# Patient Record
Sex: Female | Born: 1950 | Race: White | Hispanic: No | Marital: Married
Health system: Southern US, Community
[De-identification: ages and names within clinical notes are randomized; demographics above are authoritative.]

## PROBLEM LIST (undated history)

## (undated) DIAGNOSIS — E039 Hypothyroidism, unspecified: Secondary | ICD-10-CM

## (undated) HISTORY — PX: BREAST BIOPSY: SHX20

---

## 2019-10-06 ENCOUNTER — Ambulatory Visit: Payer: Self-pay | Attending: Internal Medicine

## 2019-10-06 DIAGNOSIS — Z23 Encounter for immunization: Secondary | ICD-10-CM | POA: Insufficient documentation

## 2019-10-06 NOTE — Progress Notes (Signed)
   Covid-19 Vaccination Clinic  Name:  Kirby Argueta    MRN: [id number] DOB: 28-Mar-1951  10/06/2019  Ms. Tague was observed post Covid-19 immunization for 15 minutes without incidence. She was provided with Vaccine Information Sheet and instruction to access the V-Safe system.   Ms. Kruser was instructed to call 911 with any severe reactions post vaccine: Marland Kitchen Difficulty breathing  . Swelling of your face and throat  . A fast heartbeat  . A bad rash all over your body  . Dizziness and weakness    Immunizations Administered    Name Date Dose VIS Date Route   Pfizer COVID-19 Vaccine 10/06/2019 11:10 AM 0.3 mL 07/26/2019 Intramuscular   Manufacturer: [employer], [employer]   Lot: [id number]   NDC: [address]-6

## 2019-10-30 ENCOUNTER — Ambulatory Visit: Payer: Self-pay | Attending: Internal Medicine

## 2019-10-30 DIAGNOSIS — Z23 Encounter for immunization: Secondary | ICD-10-CM

## 2019-10-30 NOTE — Progress Notes (Signed)
   Covid-19 Vaccination Clinic  Name:  Cindy Kidd    MRN: [id number] DOB: 05/18/1951  10/30/2019  Cindy Kidd was observed post Covid-19 immunization for 15 minutes without incident. She was provided with Vaccine Information Sheet and instruction to access the V-Safe system.   Cindy Kidd was instructed to call 911 with any severe reactions post vaccine: Marland Kitchen Difficulty breathing  . Swelling of face and throat  . A fast heartbeat  . A bad rash all over body  . Dizziness and weakness   Immunizations Administered    Name Date Dose VIS Date Route   Pfizer COVID-19 Vaccine 10/30/2019 10:22 AM 0.3 mL 07/26/2019 Intramuscular   Manufacturer: [employer], [employer]   Lot: [id number]   NDC: [address]-3

## 2020-06-01 ENCOUNTER — Other Ambulatory Visit: Payer: Self-pay | Admitting: Sports Medicine

## 2020-06-01 DIAGNOSIS — M25462 Effusion, left knee: Secondary | ICD-10-CM

## 2020-06-01 DIAGNOSIS — M25562 Pain in left knee: Secondary | ICD-10-CM

## 2020-06-17 ENCOUNTER — Ambulatory Visit
Admission: RE | Admit: 2020-06-17 | Discharge: 2020-06-17 | Disposition: A | Discharge status: Home / Self Care | Payer: Medicare PPO | Source: Ambulatory Visit | Attending: Sports Medicine | Admitting: Sports Medicine

## 2020-06-17 ENCOUNTER — Other Ambulatory Visit: Payer: Self-pay

## 2020-06-17 DIAGNOSIS — M25562 Pain in left knee: Secondary | ICD-10-CM | POA: Insufficient documentation

## 2020-06-17 DIAGNOSIS — M25462 Effusion, left knee: Secondary | ICD-10-CM | POA: Insufficient documentation

## 2020-12-15 ENCOUNTER — Other Ambulatory Visit: Payer: Self-pay | Admitting: Internal Medicine

## 2020-12-15 DIAGNOSIS — Z1231 Encounter for screening mammogram for malignant neoplasm of breast: Secondary | ICD-10-CM

## 2020-12-23 ENCOUNTER — Ambulatory Visit
Admission: RE | Admit: 2020-12-23 | Discharge: 2020-12-23 | Disposition: A | Discharge status: Home / Self Care | Payer: Medicare PPO | Source: Ambulatory Visit | Attending: Internal Medicine | Admitting: Internal Medicine

## 2020-12-23 ENCOUNTER — Other Ambulatory Visit: Payer: Self-pay

## 2020-12-23 DIAGNOSIS — Z1231 Encounter for screening mammogram for malignant neoplasm of breast: Secondary | ICD-10-CM | POA: Insufficient documentation

## 2021-01-01 ENCOUNTER — Other Ambulatory Visit: Payer: Self-pay | Admitting: *Deleted

## 2021-01-01 ENCOUNTER — Inpatient Hospital Stay
Admission: RE | Admit: 2021-01-01 | Discharge: 2021-01-01 | Disposition: A | Discharge status: Home / Self Care | Payer: Self-pay | Source: Ambulatory Visit | Attending: *Deleted | Admitting: *Deleted

## 2021-01-01 DIAGNOSIS — Z1231 Encounter for screening mammogram for malignant neoplasm of breast: Secondary | ICD-10-CM

## 2021-01-04 ENCOUNTER — Other Ambulatory Visit: Payer: Self-pay | Admitting: Internal Medicine

## 2021-01-04 DIAGNOSIS — R928 Other abnormal and inconclusive findings on diagnostic imaging of breast: Secondary | ICD-10-CM

## 2021-01-04 DIAGNOSIS — R921 Mammographic calcification found on diagnostic imaging of breast: Secondary | ICD-10-CM

## 2022-01-14 IMAGING — MG MM DIGITAL SCREENING BILAT W/ TOMO AND CAD
8 series · 8 of 24 positions shown · non-contrast
Comparison: Prior outside studies.

CLINICAL DATA: Screening.

EXAM:
DIGITAL SCREENING BILATERAL MAMMOGRAM WITH TOMOSYNTHESIS AND CAD
TECHNIQUE: Bilateral screening digital craniocaudal and mediolateral oblique
mammograms were obtained. Bilateral screening digital breast
tomosynthesis was performed. The images were evaluated with
computer-aided detection.

[R CC synth-2D]
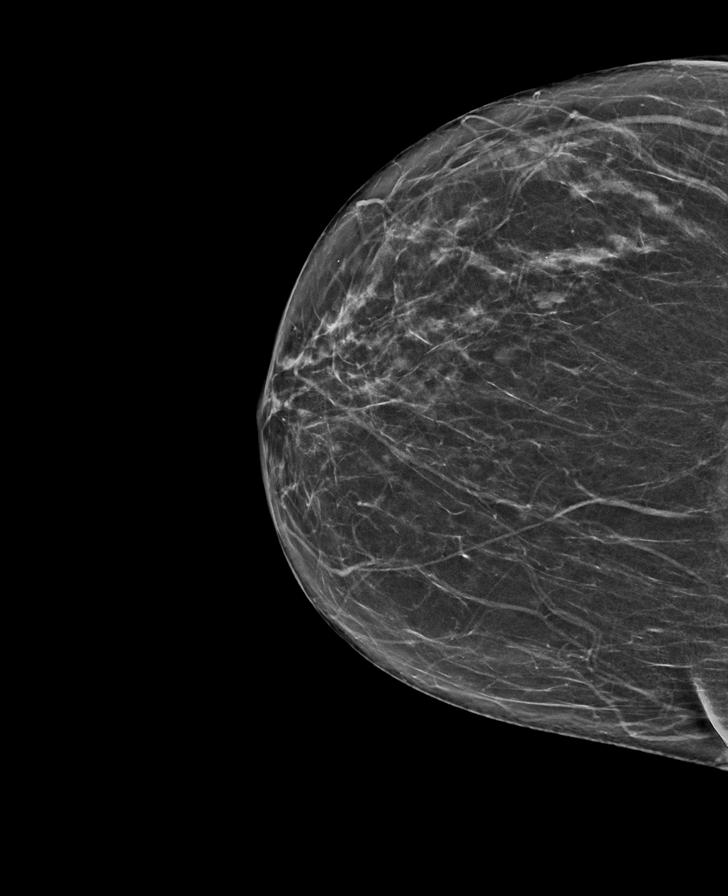

[L MLO synth-2D]
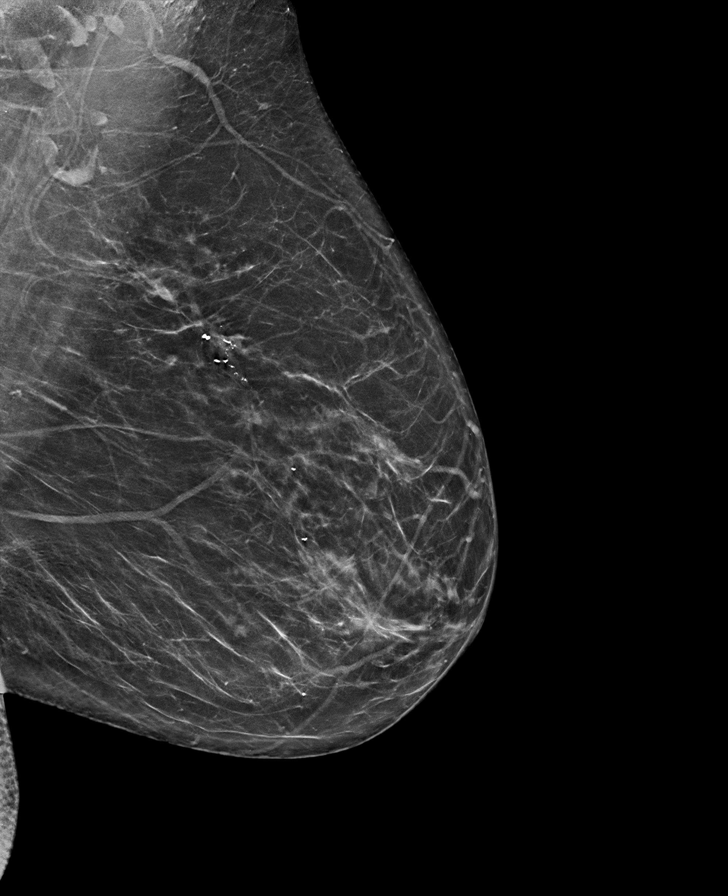

[R MLO synth-2D]
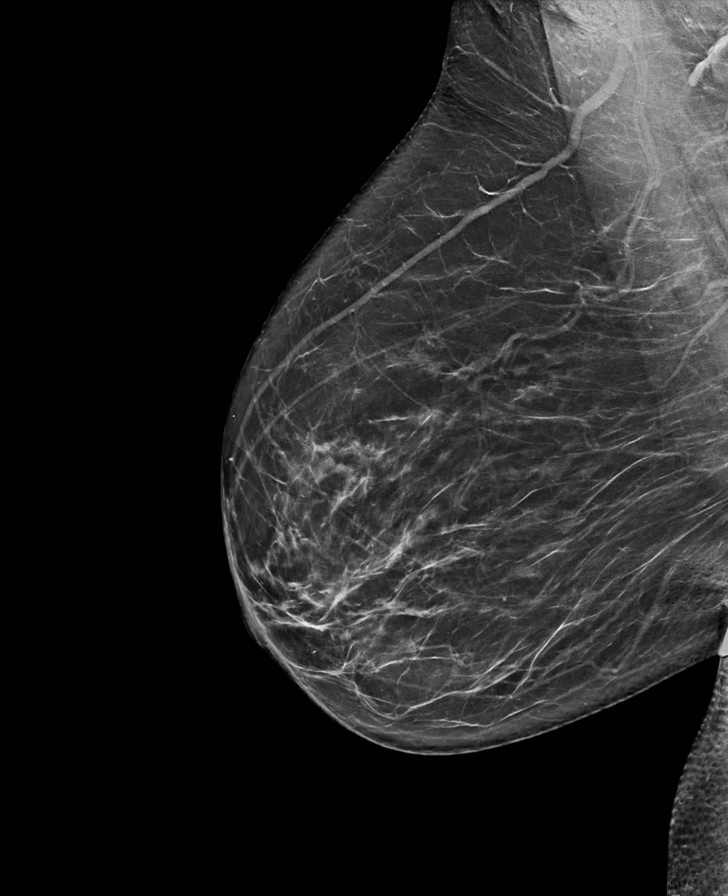

[L CC synth-2D]
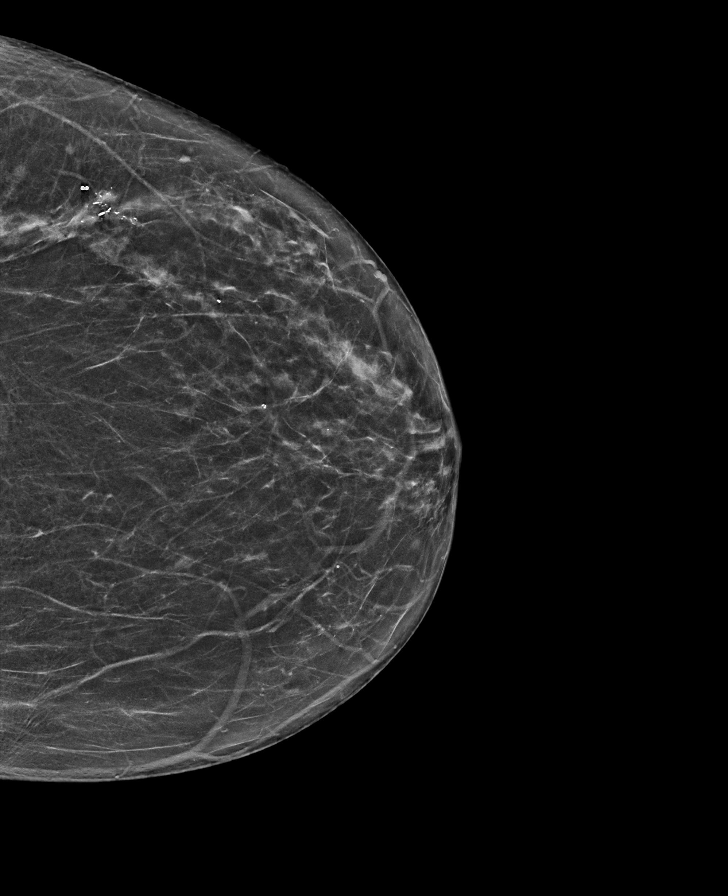

[R MLO tomo · tomo slice 38/75.0]
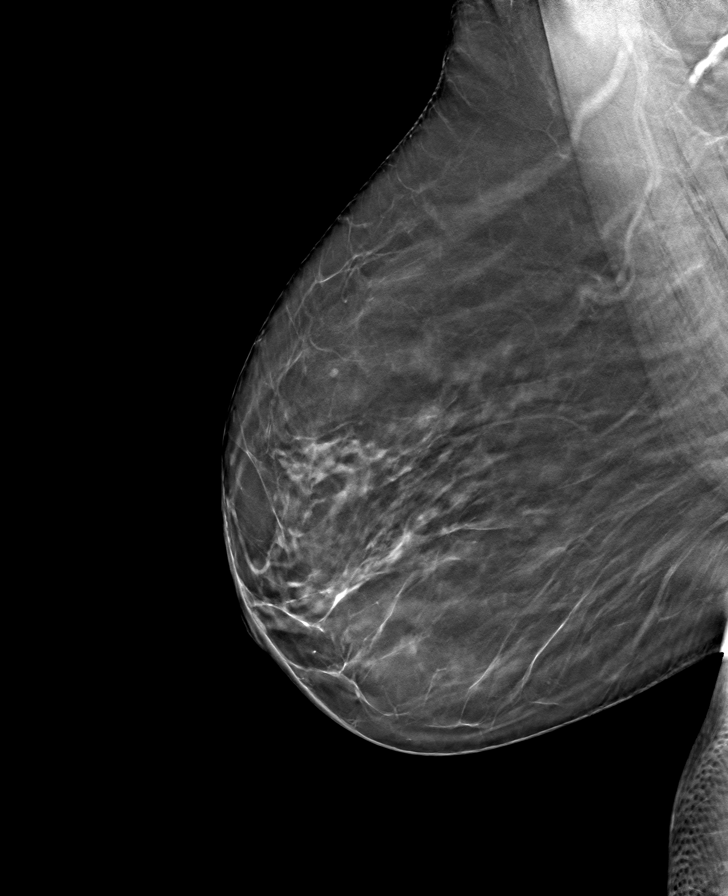

[L CC tomo · tomo slice 32/63.0]
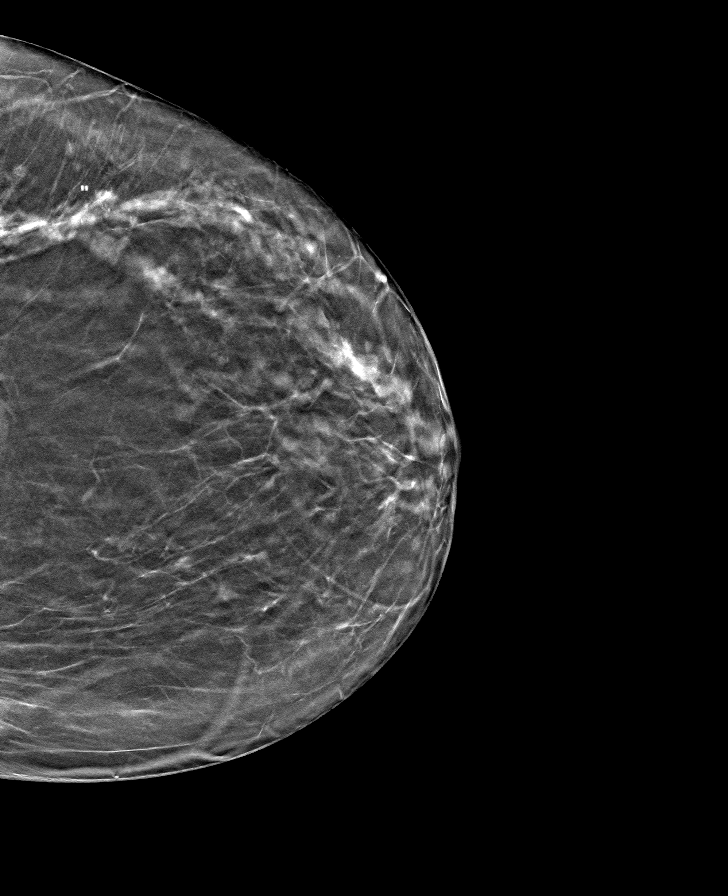

[R CC tomo · tomo slice 32/63.0]
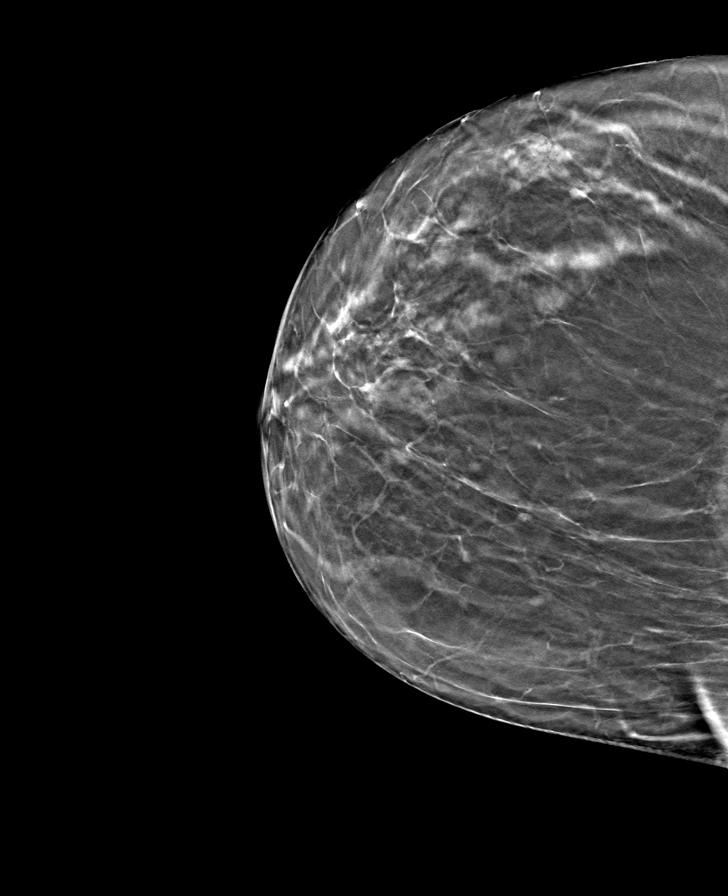

[L MLO tomo · tomo slice 37/72.0]
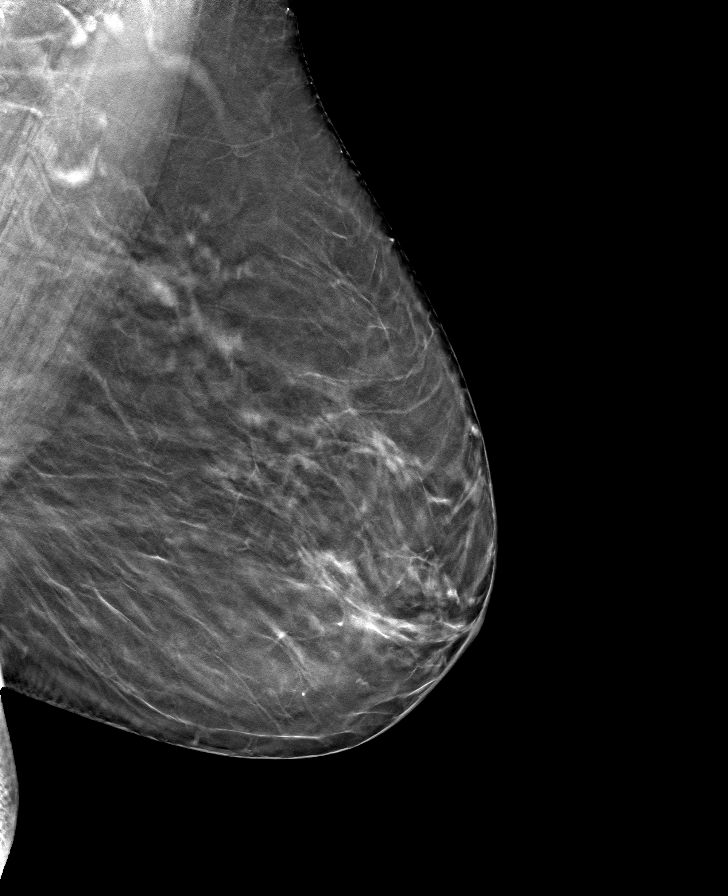

[8 of 24 positions shown; findings below may reference images not displayed]

ACR Breast Density Category b: There are scattered areas of
fibroglandular density.
FINDINGS: In the right breast possible distortion requires further evaluation.

In the left breast developing calcifications requires further
evaluation.
IMPRESSION: Further evaluation is suggested for possible distortion in the right
breast.

Further evaluation is suggested for calcifications in the left
breast.

RECOMMENDATION:
Diagnostic mammogram and possibly ultrasound of both breasts.
(Code:[id number])

The patient will be contacted regarding the findings, and additional
imaging will be scheduled.

BI-RADS CATEGORY  0: Incomplete. Need additional imaging evaluation
and/or prior mammograms for comparison.

## 2022-05-18 ENCOUNTER — Ambulatory Visit: Payer: Medicare PPO | Admitting: Dermatology

## 2022-05-18 ENCOUNTER — Telehealth: Payer: Self-pay | Admitting: Dermatology

## 2022-05-18 ENCOUNTER — Encounter: Payer: Self-pay | Admitting: Dermatology

## 2022-05-18 DIAGNOSIS — L659 Nonscarring hair loss, unspecified: Secondary | ICD-10-CM

## 2022-05-18 MED ORDER — KETOCONAZOLE 2 % EX SHAM: MEDICATED_SHAMPOO | CUTANEOUS | 5 refills | Status: DC

## 2022-05-18 MED ORDER — DUTASTERIDE 0.5 MG PO CAPS: 0.5000 mg | ORAL_CAPSULE | Freq: Every day | ORAL | 5 refills | Status: DC

## 2022-05-18 NOTE — Telephone Encounter (Signed)
Please call patient and let her know I recommend dutasteride 0.5 mg capsule 1 daily for her hair loss. As long as she does not have any liver issues (her last liver blood work looked fine), there should be no concerns with taking it. Thank you!

## 2022-05-18 NOTE — Progress Notes (Signed)
   New Patient Visit  Subjective  Cindy Kidd is a 71 y.o. female who presents for the following: Alopecia (Patient here today for hair loss, present for 5-10 years. Patient has used topical 5% minoxidil but is allergic. 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  Patient accompanied by husband who contributes to history. Hair loss has worsened in the last 3-4 years per husband.   The following portions of the chart were reviewed this encounter and updated as appropriate:   Allergies  Meds  Problems  Med Hx  Surg Hx  Fam Hx      Review of Systems:  No other skin or systemic complaints except as noted in HPI or Assessment and Plan.  Objective  Well appearing patient in no apparent distress; mood and affect are within normal limits.  A focused examination was performed including scalp. Relevant physical exam findings are noted in the Assessment and Plan.  Scalp Negative hair pull Diffuse thinning of the crown and widening of the midline part with retention of the frontal hairline - Reviewed progressive nature and prognosis.     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 Condition is bothersome/symptomatic for patient. Currently flared.  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  In women androgenetic alopecia is commonly associated with menopause but may occur any time after puberty.  It causes hair thinning primarily on the crown with widening of the part and temporal hairline recession.  Can use OTC Rogaine (minoxidil) 5% solution/foam as directed.  Oral treatments in female patients who have no contraindication may include : - Low dose oral minoxidil 1.25 - 5mg  daily - Spironolactone 50 - 100mg  bid - Dutasteride 0.5 mg daily or Finasteride 1 - 5 mg daily Adjunctive therapies include: - Low Level Laser Light Therapy (LLLT) -  Platelet-rich plasma injections (PRP) - Hair Transplants or scalp reduction   With her PCP check, she will request Vitamin D, Ferritin and Thyroid testing. She has a history of low vitamin D. We will recommend ferritin to ensure it is in a good range for hair regrowth.   No history of heart disease.  Will plan to start dutasteride 0.5 mg daily. Patient defers minoxidil po or topical at this time.  Start ketoconazole 2% shampoo apply three times per week, massage into scalp and leave in for 10 minutes before rinsing out  Patient will also start red cap light. Advised patient and husband that it can take up to 4-6 months before seeing improvement.   ketoconazole (NIZORAL) 2 % shampoo - Scalp apply three times per week, massage into scalp and leave in for 10 minutes before rinsing out   Return in about 4 months (around 09/18/2022) for Alopecia.  Graciella Belton, RMA, am acting as scribe for Forest Gleason, MD .   Documentation: I have reviewed the above documentation for accuracy and completeness, and I agree with the above.  Forest Gleason, MD

## 2022-05-18 NOTE — Telephone Encounter (Signed)
Called patient regarding Dr. Robbi Garter medication recommendation. Patient denies any liver problems and would like Dutasteride  0.5 mg capsule sent to [facility] at [address] and [address].   Sent medication in with 6 refills.   Patient denied further questions at this time.

## 2022-05-18 NOTE — Patient Instructions (Addendum)
Recommend checking Vitamin D, Ferritin and Thyroid.   Due to recent changes in healthcare laws, you may see results of your pathology and/or laboratory studies on MyChart before the doctors have had a chance to review them. We understand that in some cases there may be results that are confusing or concerning to you. Please understand that not all results are received at the same time and often the doctors may need to interpret multiple results in order to provide you with the best plan of care or course of treatment. Therefore, we ask that you please give [place] 2 business days to thoroughly review all your results before contacting the office for clarification. Should we see a critical lab result, you will be contacted sooner.   If You Need Anything After Your Visit  If you have any questions or concerns for your doctor, please call our main line at [phone] and press option 4 to reach your doctor's medical assistant. If no one answers, please leave a voicemail as directed and we will return your call as soon as possible. Messages left after 4 pm will be answered the following business day.   You may also send [place] a message via [place]. We typically respond to MyChart messages within 1-2 business days.  For prescription refills, please ask your pharmacy to contact our office. Our fax number is [phone].  If you have an urgent issue when the clinic is closed that cannot wait until the next business day, you can page your doctor at the number below.    Please note that while we do our best to be available for urgent issues outside of office hours, we are not available 24/7.   If you have an urgent issue and are unable to reach [place], you may choose to seek medical care at your doctor's office, retail clinic, urgent care center, or emergency room.  If you have a medical emergency, please immediately call 911 or go to the emergency department.  Pager Numbers  - Dr. Nehemiah Massed: [phone]  -  Dr. Laurence Ferrari: [phone]  - Dr. Nicole Kindred: [phone]  In the event of inclement weather, please call our main line at [phone] for an update on the status of any delays or closures.  Dermatology Medication Tips: Please keep the boxes that topical medications come in in order to help keep track of the instructions about where and how to use these. Pharmacies typically print the medication instructions only on the boxes and not directly on the medication tubes.   If your medication is too expensive, please contact our office at [phone] option 4 or send [place] a message through [place].   We are unable to tell what your co-pay for medications will be in advance as this is different depending on your insurance coverage. However, we may be able to find a substitute medication at lower cost or fill out paperwork to get insurance to cover a needed medication.   If a prior authorization is required to get your medication covered by your insurance company, please allow [place] 1-2 business days to complete this process.  Drug prices often vary depending on where the prescription is filled and some pharmacies may offer cheaper prices.  The website www.goodrx.com contains coupons for medications through different pharmacies. The prices here do not account for what the cost may be with help from insurance (it may be cheaper with your insurance), but the website can give you the price if you did not use any insurance.  - You  can print the associated coupon and take it with your prescription to the pharmacy.  - You may also stop by our office during regular business hours and pick up a GoodRx coupon card.  - If you need your prescription sent electronically to a different pharmacy, notify our office through [facility] or by phone at [phone] option 4.     Si Usted Necesita Algo Despus de Su Visita  Tambin puede enviarnos un mensaje a travs de Pharmacist, community. Por lo general respondemos a los  mensajes de MyChart en el transcurso de 1 a 2 das hbiles.  Para renovar recetas, por favor pida a su farmacia que se ponga en contacto con nuestra oficina. Harland Dingwall de fax es [place] [phone].  Si tiene un asunto urgente cuando la clnica est cerrada y que no puede esperar hasta el siguiente da hbil, puede llamar/localizar a su doctor(a) al nmero que aparece a continuacin.   Por favor, tenga en cuenta que aunque hacemos todo lo posible para estar disponibles para asuntos urgentes fuera del horario de [place], no estamos disponibles las 24 horas del da, los 7 das de la [place].   Si tiene un problema urgente y no puede comunicarse con nosotros, puede optar por buscar atencin mdica  en el consultorio de su doctor(a), en una clnica privada, en un centro de atencin urgente o en una sala de emergencias.  Si tiene Engineering geologist, por favor llame inmediatamente al 911 o vaya a la sala de emergencias.  Nmeros de bper  - Dr. Nehemiah Massed: [phone]  - Dra. Moye: [phone]  - Dra. Nicole Kindred: [phone]  En caso de inclemencias del [place], por favor llame a Johnsie Kindred principal al [phone] para una actualizacin sobre el [employer] de cualquier retraso o cierre.  Consejos para la medicacin en dermatologa: Por favor, guarde las cajas en las que vienen los medicamentos de uso tpico para ayudarle a seguir las instrucciones sobre dnde y cmo usarlos. Las farmacias generalmente imprimen las instrucciones del medicamento slo en las cajas y no directamente en los tubos del [place].   Si su medicamento es muy [place], por favor, pngase en contacto con Zigmund Daniel llamando al [phone] y presione la opcin 4 o envenos un mensaje a travs de Pharmacist, community.   No podemos decirle cul ser su copago por los medicamentos por adelantado ya que esto es diferente dependiendo de la cobertura de su seguro. Sin embargo, es posible que podamos encontrar un medicamento sustituto a  Electrical engineer un formulario para que el seguro cubra el medicamento que se considera necesario.   Si se requiere una autorizacin previa para que su compaa de seguros [place] su medicamento, por favor permtanos de 1 a 2 das hbiles para completar este proceso.  Los precios de los medicamentos varan con frecuencia dependiendo del Environmental consultant de dnde se surte la receta y alguna farmacias pueden ofrecer precios ms baratos.  El sitio web www.goodrx.com tiene cupones para medicamentos de Airline pilot. Los precios aqu no tienen en cuenta lo que podra costar con la ayuda del seguro (puede ser ms barato con su seguro), pero el sitio web puede darle el precio si no utiliz Research scientist (physical sciences).  - Puede imprimir el cupn correspondiente y llevarlo con su receta a la farmacia.  - Tambin puede pasar por nuestra oficina durante el horario de atencin regular y Charity fundraiser una tarjeta de cupones de GoodRx.  - Si necesita que su receta se enve electrnicamente a una farmacia diferente, informe a  nuestra oficina a travs de MyChart de [place] o por telfono llamando al [phone] y presione la opcin 4.

## 2022-05-18 NOTE — Addendum Note (Signed)
Addended by: Ruthell Rummage A on: 05/18/2022 04:30 PM   Modules accepted: Orders

## 2022-09-29 ENCOUNTER — Encounter: Payer: Self-pay | Admitting: Dermatology

## 2022-09-29 ENCOUNTER — Ambulatory Visit: Payer: Medicare PPO | Admitting: Dermatology

## 2022-09-29 VITALS — BP 110/72 | HR 96

## 2022-09-29 DIAGNOSIS — L219 Seborrheic dermatitis, unspecified: Secondary | ICD-10-CM | POA: Diagnosis not present

## 2022-09-29 DIAGNOSIS — L239 Allergic contact dermatitis, unspecified cause: Secondary | ICD-10-CM | POA: Diagnosis not present

## 2022-09-29 DIAGNOSIS — L659 Nonscarring hair loss, unspecified: Secondary | ICD-10-CM

## 2022-09-29 MED ORDER — TACROLIMUS 0.1 % EX OINT: TOPICAL_OINTMENT | Freq: Two times a day (BID) | CUTANEOUS | 2 refills | Status: DC

## 2022-09-29 MED ORDER — KETOCONAZOLE 2 % EX SHAM: MEDICATED_SHAMPOO | CUTANEOUS | 5 refills | Status: DC

## 2022-09-29 MED ORDER — DUTASTERIDE 0.5 MG PO CAPS: 0.5000 mg | ORAL_CAPSULE | Freq: Every day | ORAL | 5 refills | Status: DC

## 2022-09-29 NOTE — Patient Instructions (Addendum)
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  In women androgenetic alopecia is commonly associated with menopause but may occur any time after puberty.  It causes hair thinning primarily on the crown with widening of the part and temporal hairline recession.  Can use OTC Rogaine (minoxidil) 5% solution/foam as directed.  Oral treatments in female patients who have no contraindication may include : - Low dose oral minoxidil 1.25 - 31m daily - Spironolactone 50 - 1034mbid - Dutasteride 0.5 mg daily or Finasteride 1 - 5 mg daily Adjunctive therapies include: - Low Level Laser Light Therapy (LLLT) - Platelet-rich plasma injections (PRP) - Hair Transplants or scalp reduction       Due to recent changes in healthcare laws, you may see results of your pathology and/or laboratory studies on MyChart before the doctors have had a chance to review them. We understand that in some cases there may be results that are confusing or concerning to you. Please understand that not all results are received at the same time and often the doctors may need to interpret multiple results in order to provide you with the best plan of care or course of treatment. Therefore, we ask that you please give [place] business days to thoroughly review all your results before contacting the office for clarification. Should we see a critical lab result, you will be contacted sooner.   If You Need Anything After Your Visit  If you have any questions or concerns for your doctor, please call our main line at  [phone] press option 4 to reach your doctor's medical assistant. If no one answers, please leave a voicemail as directed and we will return your call as soon as possible. Messages left after 4 pm will be answered the following business day.   You may also send us[place] message via [place]We typically respond to MyChart messages within 1-2 business days.  For prescription refills, please ask your pharmacy to contact our office. Our fax number is [phone] If you have an urgent issue when the clinic is closed that cannot wait until the next business day, you can page your doctor at the number below.    Please note that while we do our best to be available for urgent issues outside of office hours, we are not available 24/7.   If you have an urgent issue and are unable to reach usKoreayou may choose to seek medical care at your doctor's office, retail clinic, urgent care center, or emergency room.  If you have a medical emergency, please immediately call 911 or go to the emergency department.  Pager Numbers  - Dr. KoNehemiah [phone]- Dr. MoLaurence [phone]- Dr. StNicole [phone] the event of inclement weather, please call our main line at [phone] an update on the status of any delays or closures.  Dermatology Medication Tips: Please keep the boxes that topical medications come in in order to help keep track of the instructions about where and how to use these. Pharmacies typically print the medication instructions only on the boxes  and not directly on the medication tubes.   If your medication is too expensive, please contact our office at [phone] option 4 or send [place] a message through [place].   We are unable to tell what your co-pay for medications will be in advance as this is different depending on your insurance coverage. However, we may be able to find a substitute medication at lower cost or fill out paperwork to get insurance to cover a needed  medication.   If a prior authorization is required to get your medication covered by your insurance company, please allow [place] 1-2 business days to complete this process.  Drug prices often vary depending on where the prescription is filled and some pharmacies may offer cheaper prices.  The website www.goodrx.com contains coupons for medications through different pharmacies. The prices here do not account for what the cost may be with help from insurance (it may be cheaper with your insurance), but the website can give you the price if you did not use any insurance.  - You can print the associated coupon and take it with your prescription to the pharmacy.  - You may also stop by our office during regular business hours and pick up a GoodRx coupon card.  - If you need your prescription sent electronically to a different pharmacy, notify our office through [facility] or by phone at [phone] option 4.     Si Usted Necesita Algo Despus de Su Visita  Tambin puede enviarnos un mensaje a travs de Pharmacist, community. Por lo general respondemos a los mensajes de MyChart en el transcurso de 1 a 2 das hbiles.  Para renovar recetas, por favor pida a su farmacia que se ponga en contacto con nuestra oficina. Harland Dingwall de fax es [place] [phone].  Si tiene un asunto urgente cuando la clnica est cerrada y que no puede esperar hasta el siguiente da hbil, puede llamar/localizar a su doctor(a) al nmero que aparece a continuacin.   Por favor, tenga en cuenta que aunque hacemos todo lo posible para estar disponibles para asuntos urgentes fuera del horario de [place], no estamos disponibles las 24 horas del da, los 7 das de la [place].   Si tiene un problema urgente y no puede comunicarse con nosotros, puede optar por buscar atencin mdica  en el consultorio de su doctor(a), en una clnica privada, en un centro de atencin urgente o en una sala de emergencias.  Si tiene Engineering geologist,  por favor llame inmediatamente al 911 o vaya a la sala de emergencias.  Nmeros de bper  - Dr. Nehemiah Massed: [phone]  - Dra. Moye: [phone]  - Dra. Nicole Kindred: [phone]  En caso de inclemencias del [place], por favor llame a Johnsie Kindred principal al [phone] para una actualizacin sobre el [place] de cualquier retraso o cierre.  Consejos para la medicacin en dermatologa: Por favor, guarde las cajas en las que vienen los medicamentos de uso tpico para ayudarle a seguir las instrucciones sobre dnde y cmo usarlos. Las farmacias generalmente imprimen las instrucciones del medicamento slo en las cajas y no directamente en los tubos del [place].   Si su medicamento es muy [place], por favor, pngase en contacto con Zigmund Daniel llamando al [phone] y presione la opcin 4 o envenos un mensaje a travs de Pharmacist, community.   No podemos decirle cul ser su copago por los medicamentos por adelantado ya que esto es diferente dependiendo de la cobertura de su seguro. Sin embargo, es posible que podamos Pension scheme manager  un medicamento sustituto a Electrical engineer un formulario para que el seguro cubra el medicamento que se considera necesario.   Si se requiere una autorizacin previa para que su compaa de seguros [place] su medicamento, por favor permtanos de 1 a 2 das hbiles para completar este proceso.  Los precios de los medicamentos varan con frecuencia dependiendo del Environmental consultant de dnde se surte la receta y alguna farmacias pueden ofrecer precios ms baratos.  El sitio web www.goodrx.com tiene cupones para medicamentos de Airline pilot. Los precios aqu no tienen en cuenta lo que podra costar con la ayuda del seguro (puede ser ms barato con su seguro), pero el sitio web puede darle el precio si no utiliz Research scientist (physical sciences).  - Puede imprimir el cupn correspondiente y llevarlo con su receta a la farmacia.  - Tambin puede pasar por nuestra oficina durante el horario de atencin  regular y Charity fundraiser una tarjeta de cupones de GoodRx.  - Si necesita que su receta se enve electrnicamente a una farmacia diferente, informe a nuestra oficina a travs de MyChart de [place] o por telfono llamando al [phone] y presione la opcin 4.

## 2022-09-29 NOTE — Progress Notes (Signed)
Follow-Up Visit   Subjective  Cindy Kidd is a 72 y.o. female who presents for the following: OTHER (Reports some scaling at eyebrows. Has used ketoconazole shampoo but still scaly. ) and Alopecia (4 month follow up. Currently using red cap treatment, ketoconazole shampoo, dutasteride 0.5 mg cap. Also reports starting 50000 unit capsule of vitamin d weekly. Has seen new hair growth at scalp. ).  The following portions of the chart were reviewed this encounter and updated as appropriate:  Allergies  Meds  Problems  Med Hx  Surg Hx  Fam Hx      Review of Systems: No other skin or systemic complaints except as noted in HPI or Assessment and Plan.   Objective  Well appearing patient in no apparent distress; mood and affect are within normal limits.  A focused examination was performed including scalp, eyebrows. Relevant physical exam findings are noted in the Assessment and Plan.  scalp See photos         b/l eyebrows Pink patches with greasy scale.    Assessment & Plan  Alopecia scalp  Chronic and persistent condition with duration or expected duration over one year. Condition is symptomatic/ bothersome to patient. Not currently at goal.   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  In women androgenetic alopecia is commonly associated with menopause but may occur any time after puberty.  It causes hair thinning primarily on the crown with widening of the part and temporal hairline recession.  Can use OTC Rogaine (minoxidil) 5% solution/foam as directed.  Oral treatments in female patients who have no contraindication may include : - Low dose oral minoxidil 1.25 - 51m daily - Spironolactone 50 - 1071mbid - Dutasteride 0.5 mg daily or Finasteride 1 - 5 mg daily Adjunctive therapies include: - Low Level Laser Light Therapy (LLLT) - Platelet-rich plasma injections (PRP) - Hair Transplants or scalp reduction     Vitamin D reviewed. She has low vitamin D, currently supplementing. PCP did not check ferritin. TSH reviewed and wnl.   No history of heart disease but seems to be allergic to topical minoxidil. She tried solution and foam versions multiple times and also had a reaction to a hair growth shampoo used at the hairdresser.  Continue vitamin d 50,000 units as prescribed  Continue red light cap treatment.   Continue dutasteride 0.5 mg daily.  Continue ketoconazole 2% shampoo apply three times per week, massage into scalp and leave in for 10 minutes before rinsing out   ketoconazole (NIZORAL) 2 % shampoo - scalp apply three times per week, massage into scalp and leave in for 10 minutes before rinsing out  dutasteride (AVODART) 0.5 MG capsule - scalp Take 1 capsule (0.5 mg total) by mouth daily.  Seborrheic dermatitis b/l eyebrows  Chronic and persistent condition with duration or expected duration over one year. Condition is symptomatic/ bothersome to patient. Not currently at goal.  Seborrheic Dermatitis  -  is a chronic persistent rash characterized by pinkness and scaling most commonly of the mid face but also can occur on the scalp (dandruff), ears; mid chest, mid back and groin.  It tends to be exacerbated by stress and cooler weather.  People who have neurologic disease may experience new onset or exacerbation of existing seborrheic dermatitis.  The condition is not curable but treatable and can be controlled.  Start tacrolimus 0.1 % ointment - apply topically to twice a day as needed  Rx sent to  [facility]   tacrolimus (PROTOPIC) 0.1 % ointment - b/l eyebrows Apply topically 2 (two) times daily. To eyebrows and rash at face as needed  Allergic contact dermatitis, unspecified trigger Left Forearm - Anterior  Pt with history of ACD, follows with [facility]  Recommend www.skinsafeproducts.com to help find products without ingredients she is allergic to   Return for 4 - 6  month alopecia and seb derm follow up.  I, Ruthell Rummage, CMA, am acting as scribe for Forest Gleason, MD.   Documentation: I have reviewed the above documentation for accuracy and completeness, and I agree with the above.  Forest Gleason, MD

## 2023-03-29 ENCOUNTER — Encounter: Payer: Self-pay | Admitting: Dermatology

## 2023-03-29 ENCOUNTER — Ambulatory Visit: Payer: Medicare PPO | Admitting: Dermatology

## 2023-03-29 VITALS — BP 109/76 | HR 79

## 2023-03-29 DIAGNOSIS — L814 Other melanin hyperpigmentation: Secondary | ICD-10-CM

## 2023-03-29 DIAGNOSIS — L649 Androgenic alopecia, unspecified: Secondary | ICD-10-CM

## 2023-03-29 DIAGNOSIS — L659 Nonscarring hair loss, unspecified: Secondary | ICD-10-CM

## 2023-03-29 DIAGNOSIS — Z7189 Other specified counseling: Secondary | ICD-10-CM

## 2023-03-29 DIAGNOSIS — L821 Other seborrheic keratosis: Secondary | ICD-10-CM

## 2023-03-29 DIAGNOSIS — L219 Seborrheic dermatitis, unspecified: Secondary | ICD-10-CM

## 2023-03-29 DIAGNOSIS — Z79899 Other long term (current) drug therapy: Secondary | ICD-10-CM

## 2023-03-29 MED ORDER — KETOCONAZOLE 2 % EX SHAM: MEDICATED_SHAMPOO | CUTANEOUS | 5 refills | Status: AC

## 2023-03-29 MED ORDER — DUTASTERIDE 0.5 MG PO CAPS: 0.5000 mg | ORAL_CAPSULE | Freq: Every day | ORAL | 5 refills | Status: AC

## 2023-03-29 NOTE — Progress Notes (Signed)
Follow-Up Visit   Subjective  Cindy Kidd is a 72 y.o. female who presents for the following: 6 month follow up for alopecia. Currently using red cap treatment daily, ketoconazole shampoo, taking dutasteride 0.5 mg cap. Also reports starting 32440 unit capsule of vitamin d weekly. She has not noticed any new hair growth but states not losing hair as badly as before.   Allergic to topical minoxidil. Causes itching and redness. Sometimes would break out with bumps. States she was having dizziness and thought was related to Dutasteride so she D/C. Later found out she needed new prescription for eyeglasses. Her husband then refilled dutasteride as he is a physician.   6 month seborrheic dermatitis follow up. Eyebrows States she is allergic to Tacrolimus ointment.   The following portions of the chart were reviewed this encounter and updated as appropriate: medications, allergies, medical history  Review of Systems:  No other skin or systemic complaints except as noted in HPI or Assessment and Plan.  Objective  Well appearing patient in no apparent distress; mood and affect are within normal limits.  A focused examination was performed of the following areas: Scalp, face  Relevant exam findings are noted in the Assessment and Plan.               Assessment & Plan   ANDROGENETIC ALOPECIA (FEMALE PATTERN HAIR LOSS) Exam: Diffuse thinning of the crown and widening of the midline part with retention of the frontal hairline  Chronic and persistent condition with duration or expected duration over one year. Condition is symptomatic/ bothersome to patient. currently at goal.   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  In women androgenetic alopecia is commonly associated with menopause but may occur any time after puberty.  It causes hair thinning primarily on the crown with widening of the part and temporal hairline recession.   Can use OTC Rogaine (minoxidil) 5% solution/foam as directed.  Oral treatments in female patients who have no contraindication may include : - Low dose oral minoxidil 1.25 - 5mg  daily - Spironolactone 50 - 100mg  bid - Finasteride 2.5 - 5 mg daily   Treatment Plan:  Continue vitamin d 50,000 units as prescribed   Continue red light cap treatment.    Continue dutasteride 0.5 mg daily.   Continue ketoconazole 2% shampoo apply three times per week, massage into scalp and leave in for 10 minutes before rinsing out  Defers minoxidil given irritation and rash with topical minoxidil  Discussed that goal is to slow down and stabilize hair loss. Medications will not grow new hair.   Long term medication management.  Patient is using long term (months to years) prescription medication  to control their dermatologic condition.  These medications require periodic monitoring to evaluate for efficacy and side effects and may require periodic laboratory monitoring.    Androgenic alopecia  Seborrheic dermatitis  Encounter for long-term (current) use of medications  Alopecia  Related Medications dutasteride (AVODART) 0.5 MG capsule Take 1 capsule (0.5 mg total) by mouth daily.  ketoconazole (NIZORAL) 2 % shampoo apply three times per week, massage into scalp and leave in for 10 minutes before rinsing out   SEBORRHEIC DERMATITIS Exam: Pink patches with greasy scale at B/L eyebrows  Chronic and persistent condition with duration or expected duration over one year. Condition is flaring and not currently at goal.   Seborrheic Dermatitis is a chronic persistent rash characterized by pinkness and scaling most commonly of  the mid face but also can occur on the scalp (dandruff), ears; mid chest, mid back and groin.  It tends to be exacerbated by stress and cooler weather.  People who have neurologic disease may experience new onset or exacerbation of existing seborrheic dermatitis.  The condition  is not curable but treatable and can be controlled.  Treatment Plan: Sent message asking if patient wants to try: compounded tacrolimus cream BID (6262 TACROLIMUS 0.15% CERAVE CREAM 30 gram $45 Vanishing Cream  Propylene Glycol Free  LENTIGINES/Seborrheic keratoses Exam: scattered tan macules Due to sun exposure Treatment Plan: Benign-appearing, observe. Recommend daily broad spectrum sunscreen SPF 30+ to sun-exposed areas, reapply every 2 hours as needed.  Call for any changes   Counseling for BBL / IPL / Laser and Coordination of Care Discussed the treatment option of Broad Band Light (BBL) /Intense Pulsed Light (IPL)/ Laser for skin discoloration, including brown spots and redness.  Typically we recommend at least 1-3 treatment sessions about 5-8 weeks apart for best results.  Cannot have tanned skin when BBL performed, and regular use of sunscreen/photoprotection is advised after the procedure to help maintain results. The patient's condition may also require "maintenance treatments" in the future.  The fee for BBL / laser treatments is $350 per treatment session for the whole face.  A fee can be quoted for other parts of the body.  Insurance typically does not pay for BBL/laser treatments and therefore the fee is an out-of-pocket cost. Recommend prophylactic valtrex treatment. Once scheduled for procedure, will send Rx in prior to patient's appointment.  - patient will schedule with Dr Gwen Pounds  Return in about 6 months (around 09/29/2023) for Alopecia Follow Up.  I, Lawson Radar, CMA, am acting as scribe for Elie Goody, MD.   Documentation: I have reviewed the above documentation for accuracy and completeness, and I agree with the above.  Elie Goody, MD

## 2023-03-29 NOTE — Patient Instructions (Addendum)
Continue vitamin d 50,000 units as prescribed   Continue red light cap treatment.    Continue dutasteride 0.5 mg daily.   Continue ketoconazole 2% shampoo apply three times per week, massage into scalp and leave in for 10 minutes before rinsing out    Can schedule BBL with Dr. Gwen Pounds: Counseling for BBL / IPL / Laser and Coordination of Care Discussed the treatment option of Broad Band Light (BBL) Windell Moulding Pulsed Light (IPL)/ Laser for skin discoloration, including brown spots and redness.  Typically we recommend at least 1-3 treatment sessions about 5-8 weeks apart for best results.  Cannot have tanned skin when BBL performed, and regular use of sunscreen/photoprotection is advised after the procedure to help maintain results. The patient's condition may also require "maintenance treatments" in the future.  The fee for BBL / laser treatments is $350 per treatment session for the whole face.  A fee can be quoted for other parts of the body.  Insurance typically does not pay for BBL/laser treatments and therefore the fee is an out-of-pocket cost. Recommend prophylactic valtrex treatment. Once scheduled for procedure, will send Rx in prior to patient's appointment.     Due to recent changes in healthcare laws, you may see results of your pathology and/or laboratory studies on MyChart before the doctors have had a chance to review them. We understand that in some cases there may be results that are confusing or concerning to you. Please understand that not all results are received at the same time and often the doctors may need to interpret multiple results in order to provide you with the best plan of care or course of treatment. Therefore, we ask that you please give [place] 2 business days to thoroughly review all your results before contacting the office for clarification. Should we see a critical lab result, you will be contacted sooner.   If You Need Anything After Your Visit  If you have any  questions or concerns for your doctor, please call our main line at [phone] and press option 4 to reach your doctor's medical assistant. If no one answers, please leave a voicemail as directed and we will return your call as soon as possible. Messages left after 4 pm will be answered the following business day.   You may also send [place] a message via MyChart. We typically respond to MyChart messages within 1-2 business days.  For prescription refills, please ask your pharmacy to contact our office. Our fax number is [phone].  If you have an urgent issue when the clinic is closed that cannot wait until the next business day, you can page your doctor at the number below.    Please note that while we do our best to be available for urgent issues outside of office hours, we are not available 24/7.   If you have an urgent issue and are unable to reach [place], you may choose to seek medical care at your doctor's office, retail clinic, urgent care center, or emergency room.  If you have a medical emergency, please immediately call 911 or go to the emergency department.  Pager Numbers  - Dr. Gwen Pounds: [phone]  - Dr. Roseanne Reno: [phone]  - Dr. Katrinka Blazing: [phone]   In the event of inclement weather, please call our main line at [phone] for an update on the status of any delays or closures.  Dermatology Medication Tips: Please keep the boxes that topical medications come in in order to help keep track of the instructions about  where and how to use these. Pharmacies typically print the medication instructions only on the boxes and not directly on the medication tubes.   If your medication is too expensive, please contact our office at [phone] option 4 or send [place] a message through MyChart.   We are unable to tell what your co-pay for medications will be in advance as this is different depending on your insurance coverage. However, we may be able to find a substitute medication at  lower cost or fill out paperwork to get insurance to cover a needed medication.   If a prior authorization is required to get your medication covered by your insurance company, please allow [place] 1-2 business days to complete this process.  Drug prices often vary depending on where the prescription is filled and some pharmacies may offer cheaper prices.  The website www.goodrx.com contains coupons for medications through different pharmacies. The prices here do not account for what the cost may be with help from insurance (it may be cheaper with your insurance), but the website can give you the price if you did not use any insurance.  - You can print the associated coupon and take it with your prescription to the pharmacy.  - You may also stop by our office during regular business hours and pick up a GoodRx coupon card.  - If you need your prescription sent electronically to a different pharmacy, notify our office through [facility] or by phone at [phone] option 4.     Si Usted Necesita Algo Despus de Su Visita  Tambin puede enviarnos un mensaje a travs de Clinical cytogeneticist. Por lo general respondemos a los mensajes de MyChart en el transcurso de 1 a 2 das hbiles.  Para renovar recetas, por favor pida a su farmacia que se ponga en contacto con nuestra oficina. Annie Sable de fax es [place] [phone].  Si tiene un asunto urgente cuando la clnica est cerrada y que no puede esperar hasta el siguiente da hbil, puede llamar/localizar a su doctor(a) al nmero que aparece a continuacin.   Por favor, tenga en cuenta que aunque hacemos todo lo posible para estar disponibles para asuntos urgentes fuera del horario de [place], no estamos disponibles las 24 horas del da, los 7 [address]  [address] de la [place].   Si tiene un problema urgente y no puede comunicarse con nosotros, puede optar por buscar atencin mdica  en el consultorio de su doctor(a), en una clnica privada, en un centro de atencin urgente  o en una sala de emergencias.  Si tiene Engineer, drilling, por favor llame inmediatamente al 911 o vaya a la sala de emergencias.  Nmeros de bper  - Dr. Gwen Pounds: [phone]  - Dra. Roseanne Reno: [phone]  - Dr. Katrinka Blazing: [phone]   En caso de inclemencias del tiempo, por favor llame a Lacy Duverney principal al [phone] para una actualizacin sobre el [place] de cualquier retraso o cierre.  Consejos para la medicacin en dermatologa: Por favor, guarde las cajas en las que vienen los medicamentos de uso tpico para ayudarle a seguir las instrucciones sobre dnde y cmo usarlos. Las farmacias generalmente imprimen las instrucciones del medicamento slo en las cajas y no directamente en los tubos del [place].   Si su medicamento es muy [place], por favor, pngase en contacto con Rolm Gala llamando al [phone] y presione la opcin 4 o envenos un mensaje a travs de Clinical cytogeneticist.   No podemos decirle cul ser su copago por los medicamentos por adelantado ya que  esto es diferente dependiendo de la cobertura de su seguro. Sin embargo, es posible que podamos encontrar un medicamento sustituto a Audiological scientist un formulario para que el seguro cubra el medicamento que se considera necesario.   Si se requiere una autorizacin previa para que su compaa de seguros [place] su medicamento, por favor permtanos de 1 a 2 das hbiles para completar [address]  Los precios de los medicamentos varan con frecuencia dependiendo del Environmental consultant de dnde se surte la receta y alguna farmacias pueden ofrecer precios ms baratos.  El sitio web www.goodrx.com tiene cupones para medicamentos de Health and safety inspector. Los precios aqu no tienen en cuenta lo que podra costar con la ayuda del seguro (puede ser ms barato con su seguro), pero el sitio web puede darle el precio si no utiliz Tourist information centre manager.  - Puede imprimir el cupn correspondiente y llevarlo con su receta a la farmacia.  -  Tambin puede pasar por nuestra oficina durante el horario de atencin regular y Education officer, museum una tarjeta de cupones de GoodRx.  - Si necesita que su receta se enve electrnicamente a una farmacia diferente, informe a nuestra oficina a travs de MyChart de [place] o por telfono llamando al [phone] y presione la opcin 4.

## 2023-04-27 ENCOUNTER — Ambulatory Visit (INDEPENDENT_AMBULATORY_CARE_PROVIDER_SITE_OTHER): Payer: Self-pay | Admitting: Dermatology

## 2023-04-27 DIAGNOSIS — L814 Other melanin hyperpigmentation: Secondary | ICD-10-CM

## 2023-04-27 DIAGNOSIS — L578 Other skin changes due to chronic exposure to nonionizing radiation: Secondary | ICD-10-CM

## 2023-04-27 DIAGNOSIS — L821 Other seborrheic keratosis: Secondary | ICD-10-CM

## 2023-04-27 NOTE — Progress Notes (Signed)
   Follow-Up Visit   Subjective  Cindy Kidd is a 72 y.o. female who presents for the following: Lentigines and seborrheic keratosis, patient here today for BBL laser treatment. She has had laser treatment done in the past for rosacea, but hasn't had issues with redness since then.   The patient has spots, moles and lesions to be evaluated, some may be new or changing and the patient may have concern these could be cancer.   The following portions of the chart were reviewed this encounter and updated as appropriate: medications, allergies, medical history  Review of Systems:  No other skin or systemic complaints except as noted in HPI or Assessment and Plan.  Objective  Well appearing patient in no apparent distress; mood and affect are within normal limits.   A focused examination was performed of the following areas: the face   Relevant exam findings are noted in the Assessment and Plan.  Face Scattered tan macules. Stuck-on, waxy, tan-brown papules and plaques -- Discussed benign etiology and prognosis.     Assessment & Plan                   Lentigines Face  And seborrheic keratosis -   Photorejuvenation - Face  Sciton BBL - 04/27/23 1400      Patient Details   Current Skin Care: None    Skin Type: III    Anesthestic Cream Applied: No    Photo Takes: Yes    Consent Signed: Yes      Treatment Details   Date: 04/27/23    Treatment #: 1    Area: Face    Filter: 1st Pass      1st Pass   Location: F    Device: 515    BBL j/cm2: 12    PW Msec Sec: 10    Cooling Temp: 25    15x45: 120  pulses  11mm: 30  pulses   Prior to the procedure, the patient's past medical history, medications, allergies, and the rare but potential risks and complications were reviewed with the patient and a signed consent was obtained.  Pre and post treatment care was discussed and instructions provided.  Patient tolerated the procedure well.   Wynelle Link avoidance was  stressed. The patient will call with any problems, questions or concerns prior to their next appointment.  Laser kit gave to patient today, but she has numerous allergies to chemical ingredients so she will check the ingredients in the kit before using them.       Return in about 2 months (around 06/27/2023) for BBL - treat "brown spots".  Maylene Roes, CMA, am acting as scribe for Armida Sans, MD .   Documentation: I have reviewed the above documentation for accuracy and completeness, and I agree with the above.  Armida Sans, MD

## 2023-04-27 NOTE — Patient Instructions (Signed)

## 2023-04-30 ENCOUNTER — Encounter: Payer: Self-pay | Admitting: Dermatology

## 2023-05-30 ENCOUNTER — Encounter: Payer: Self-pay | Admitting: Ophthalmology

## 2023-06-06 NOTE — Discharge Instructions (Signed)

## 2023-06-08 ENCOUNTER — Other Ambulatory Visit: Payer: Self-pay

## 2023-06-08 ENCOUNTER — Ambulatory Visit: Payer: Medicare PPO | Admitting: Anesthesiology

## 2023-06-08 ENCOUNTER — Encounter: Payer: Self-pay | Admitting: Ophthalmology

## 2023-06-08 ENCOUNTER — Encounter
Admission: RE | Disposition: A | Discharge status: Home / Self Care | Payer: Self-pay | Source: Home / Self Care | Attending: Ophthalmology

## 2023-06-08 ENCOUNTER — Ambulatory Visit
Admission: RE | Admit: 2023-06-08 | Discharge: 2023-06-08 | Disposition: A | Discharge status: Home / Self Care | Payer: Medicare PPO | Attending: Ophthalmology | Admitting: Ophthalmology

## 2023-06-08 DIAGNOSIS — H2511 Age-related nuclear cataract, right eye: Secondary | ICD-10-CM | POA: Insufficient documentation

## 2023-06-08 DIAGNOSIS — E039 Hypothyroidism, unspecified: Secondary | ICD-10-CM | POA: Diagnosis not present

## 2023-06-08 DIAGNOSIS — K219 Gastro-esophageal reflux disease without esophagitis: Secondary | ICD-10-CM | POA: Insufficient documentation

## 2023-06-08 HISTORY — DX: Hypothyroidism, unspecified: E03.9

## 2023-06-08 HISTORY — PX: CATARACT EXTRACTION W/PHACO: SHX586

## 2023-06-08 SURGERY — PHACOEMULSIFICATION, CATARACT, WITH IOL INSERTION
Anesthesia: Monitor Anesthesia Care | Laterality: Right

## 2023-06-08 MED ORDER — MIDAZOLAM HCL 2 MG/2ML IJ SOLN
INTRAMUSCULAR | Status: AC
  Filled 2023-06-08: qty 2

## 2023-06-08 MED ORDER — FENTANYL CITRATE (PF) 100 MCG/2ML IJ SOLN
INTRAMUSCULAR | Status: AC
  Filled 2023-06-08: qty 2

## 2023-06-08 MED ORDER — TETRACAINE HCL 0.5 % OP SOLN
OPHTHALMIC | Status: AC
  Filled 2023-06-08: qty 4

## 2023-06-08 MED ORDER — SIGHTPATH DOSE#1 BSS IO SOLN
INTRAOCULAR | Status: DC | PRN
  Administered 2023-06-08: 15 mL via INTRAOCULAR

## 2023-06-08 MED ORDER — SODIUM CHLORIDE 0.9% FLUSH: 10.0000 mL | INTRAVENOUS | Status: DC | PRN

## 2023-06-08 MED ORDER — MIDAZOLAM HCL 2 MG/2ML IJ SOLN
INTRAMUSCULAR | Status: DC | PRN
  Administered 2023-06-08 (×2): 1 mg via INTRAVENOUS

## 2023-06-08 MED ORDER — TETRACAINE HCL 0.5 % OP SOLN
1.0000 [drp] | OPHTHALMIC | Status: DC | PRN
  Administered 2023-06-08 (×3): 1 [drp] via OPHTHALMIC

## 2023-06-08 MED ORDER — FENTANYL CITRATE (PF) 100 MCG/2ML IJ SOLN
INTRAMUSCULAR | Status: DC | PRN
  Administered 2023-06-08 (×2): 50 ug via INTRAVENOUS

## 2023-06-08 MED ORDER — SIGHTPATH DOSE#1 NA HYALUR & NA CHOND-NA HYALUR IO KIT
PACK | INTRAOCULAR | Status: DC | PRN
  Administered 2023-06-08: 1 via OPHTHALMIC

## 2023-06-08 MED ORDER — MOXIFLOXACIN HCL 0.5 % OP SOLN
OPHTHALMIC | Status: DC | PRN
  Administered 2023-06-08: .2 mL via OPHTHALMIC

## 2023-06-08 MED ORDER — LIDOCAINE HCL (PF) 2 % IJ SOLN
INTRAOCULAR | Status: DC | PRN
  Administered 2023-06-08: 1 mL via INTRAOCULAR

## 2023-06-08 MED ORDER — ARMC OPHTHALMIC DILATING DROPS
1.0000 | OPHTHALMIC | Status: DC | PRN
  Administered 2023-06-08 (×3): 1 via OPHTHALMIC

## 2023-06-08 MED ORDER — ARMC OPHTHALMIC DILATING DROPS
OPHTHALMIC | Status: AC
  Filled 2023-06-08: qty 0.5

## 2023-06-08 MED ORDER — BRIMONIDINE TARTRATE-TIMOLOL 0.2-0.5 % OP SOLN
OPHTHALMIC | Status: DC | PRN
  Administered 2023-06-08: 1 [drp] via OPHTHALMIC

## 2023-06-08 MED ORDER — PHENYLEPHRINE-KETOROLAC 1-0.3 % IO SOLN
INTRAOCULAR | Status: DC | PRN
  Administered 2023-06-08: 68 mL via OPHTHALMIC

## 2023-06-08 SURGICAL SUPPLY — 10 items
CATARACT SUITE SIGHTPATH (MISCELLANEOUS) ×1
DISSECTOR HYDRO NUCLEUS 50X22 (MISCELLANEOUS) ×1 IMPLANT
DRSG TEGADERM 2-3/8X2-3/4 SM (GAUZE/BANDAGES/DRESSINGS) ×1 IMPLANT
FEE CATARACT SUITE SIGHTPATH (MISCELLANEOUS) ×1 IMPLANT
GLOVE SURG SYN 7.5 E (GLOVE) ×1
GLOVE SURG SYN 7.5 PF PI (GLOVE) ×1 IMPLANT
GLOVE SURG SYN 8.5 E (GLOVE) ×1
GLOVE SURG SYN 8.5 PF PI (GLOVE) ×1 IMPLANT
LENS CLAREON PANOPTIX 20.5 ×1 IMPLANT
LENS IOL CLRN PAN 3 20.5 IMPLANT

## 2023-06-08 NOTE — Anesthesia Preprocedure Evaluation (Signed)
Anesthesia Evaluation  Patient identified by MRN, date of birth, ID band Patient awake    Reviewed: Allergy & Precautions, H&P , NPO status , Patient's Chart, lab work & pertinent test results, reviewed documented beta blocker date and time   History of Anesthesia Complications Negative for: history of anesthetic complications  Airway Mallampati: I  TM Distance: >3 FB Neck ROM: full    Dental  (+) Dental Advidsory Given, Caps, Teeth Intact   Pulmonary neg pulmonary ROS, Continuous Positive Airway Pressure Ventilation    Pulmonary exam normal breath sounds clear to auscultation       Cardiovascular Exercise Tolerance: Good (-) hypertension(-) angina (-) Past MI and (-) Cardiac Stents Normal cardiovascular exam(-) dysrhythmias + Valvular Problems/Murmurs  Rhythm:regular Rate:Normal     Neuro/Psych negative neurological ROS  negative psych ROS   GI/Hepatic Neg liver ROS,GERD  Medicated and Controlled,,  Endo/Other  diabetes (borderline)Hypothyroidism    Renal/GU negative Renal ROS  negative genitourinary   Musculoskeletal   Abdominal   Peds  Hematology negative hematology ROS (+)   Anesthesia Other Findings Past Medical History: No date: Hypothyroidism   Reproductive/Obstetrics negative OB ROS                             Anesthesia Physical Anesthesia Plan  ASA: 2  Anesthesia Plan: MAC   Post-op Pain Management:    Induction: Intravenous  PONV Risk Score and Plan: 2 and Midazolam and Treatment may vary due to age or medical condition  Airway Management Planned: Natural Airway and Simple Face Mask  Additional Equipment:   Intra-op Plan:   Post-operative Plan:   Informed Consent: I have reviewed the patients History and Physical, chart, labs and discussed the procedure including the risks, benefits and alternatives for the proposed anesthesia with the patient or authorized  representative who has indicated his/her understanding and acceptance.     Dental Advisory Given  Plan Discussed with: Anesthesiologist, CRNA and Surgeon  Anesthesia Plan Comments:        Anesthesia Quick Evaluation

## 2023-06-08 NOTE — Op Note (Signed)
OPERATIVE NOTE  Cindy Kidd [id number] 06/08/2023   PREOPERATIVE DIAGNOSIS: Nuclear sclerotic cataract right eye. H25.11   POSTOPERATIVE DIAGNOSIS: Nuclear sclerotic cataract right eye. H25.11   PROCEDURE:  Phacoemusification with Toric posterior chamber intraocular lens placement of the right eye  Ultrasound time: Procedure(s): CATARACT EXTRACTION PHACO AND INTRAOCULAR LENS PLACEMENT (IOC) MALYUGIN VISION BLUE OMIDRIA  CLAREON PANOPTIC TORIC LENS 11.28 00:56.6 (Right)  LENS:   Implant Name Type Inv. Item Serial No. Manufacturer Lot No. LRB No. Used Action  LENS CLAREON PANOPTIX 20.5 - [id number]  LENS CLAREON PANOPTIX 20.5 [id number] SIGHTPATH  Right 1 Implanted    [id number] Toric intraocular lens with 1.50 diopters of cylindrical power with axis orientation at 006 degrees.  SURGEON:  Julious Payer. Rolley Sims, MD   ANESTHESIA:  Topical with tetracaine drops, augmented with 1% preservative-free intracameral lidocaine.   COMPLICATIONS:  None.   DESCRIPTION OF PROCEDURE:  The patient was identified in the holding room and transported to the operating room and placed in the supine position under the operating microscope.  The right eye was identified as the operative eye, which was prepped and draped in the usual sterile ophthalmic fashion.   A 1 millimeter clear-corneal paracentesis was made superotemporally. Preservative-free 1% lidocaine mixed with 1:1,000 bisulfite-free aqueous solution of epinephrine was injected into the anterior chamber. The anterior chamber was then filled with Viscoat viscoelastic. A 2.4 millimeter keratome was used to make a clear-corneal incision inferotemporally. A curvilinear capsulorrhexis was made with a cystotome and capsulorrhexis forceps. Balanced salt solution was used to hydrodissect and hydrodelineate the nucleus. Phacoemulsification was then used to remove the lens nucleus and epinucleus. The remaining cortex was then removed using the irrigation and aspiration  handpiece. Provisc was then placed into the capsular bag to distend it for lens placement. The Verion digital marker was used to align the implant at the intended axis.  A +20.50 CNWTT3 Toric lens was then injected into the capsular bag.  It was rotated clockwise until the axis marks on the lens were approximately 15 degrees in the counterclockwise direction to the intended alignment.  The viscoelastic was aspirated from the eye using the irrigation aspiration handpiece.  Then, a blunt chopper through the sideport incision was used to rotate the lens in a clockwise direction until the axis markings of the intraocular lens were lined up with the Verion alignment.  Balanced salt solution was then used to hydrate the wounds.   The anterior chamber was inflated to a physiologic pressure with balanced salt solution.  No wound leaks were noted. Vigamox was injected intracamerally.  Timolol and Brimonidine drops were applied to the eye.  The patient was taken to the recovery room in stable condition without complications of anesthesia or surgery.  [name] [place] 06/08/2023, 11:41 AM

## 2023-06-08 NOTE — Anesthesia Postprocedure Evaluation (Signed)
Anesthesia Post Note  Patient: Cindy Kidd  Procedure(s) Performed: CATARACT EXTRACTION PHACO AND INTRAOCULAR LENS PLACEMENT (IOC) MALYUGIN VISION BLUE OMIDRIA  CLAREON PANOPTIC TORIC LENS 11.28 00:56.6 (Right)  Patient location during evaluation: PACU Anesthesia Type: MAC Level of consciousness: awake and alert Pain management: pain level controlled Vital Signs Assessment: post-procedure vital signs reviewed and stable Respiratory status: spontaneous breathing, nonlabored ventilation, respiratory function stable and patient connected to nasal cannula oxygen Cardiovascular status: stable and blood pressure returned to baseline Postop Assessment: no apparent nausea or vomiting Anesthetic complications: no   No notable events documented.   Last Vitals:  Vitals:   06/08/23 1145 06/08/23 1148  BP: 99/72 112/72  Pulse: 73 75  Resp: 12 (!) 25  Temp:  (!) 36.4 C  SpO2: 99% 98%    Last Pain:  Vitals:   06/08/23 1148  TempSrc:   PainSc: 0-No pain                 Lenard Simmer

## 2023-06-08 NOTE — H&P (Signed)
[facility]   Primary Care Physician:  Enid Baas, MD Ophthalmologist: Dr. Deberah Pelton  Pre-Procedure History & Physical: HPI:  Cindy Kidd is a 72 y.o. female here for cataract surgery.   Past Medical History:  Diagnosis Date   Hypothyroidism     Past Surgical History:  Procedure Laterality Date   BREAST BIOPSY      Prior to Admission medications   Medication Sig Start Date End Date Taking? Authorizing Provider  atorvastatin (LIPITOR) 10 MG tablet Take 1 tablet by mouth daily. 12/20/21  Yes [provider]  Cyanocobalamin (VITAMIN B 12) 500 MCG TABS Take 1,000 mcg/day by mouth.   Yes [provider]  dutasteride (AVODART) 0.5 MG capsule Take 1 capsule (0.5 mg total) by mouth daily. 03/29/23  Yes Elie Goody, MD  famotidine (PEPCID) 20 MG tablet Take by mouth. 10/08/20  Yes [provider]  ketoconazole (NIZORAL) 2 % shampoo apply three times per week, massage into scalp and leave in for 10 minutes before rinsing out 03/29/23  Yes Elie Goody, MD  levothyroxine (SYNTHROID) 125 MCG tablet Take 1 tablet by mouth daily. 01/18/22  Yes [provider]  loperamide (IMODIUM) 2 MG capsule Take by mouth.   Yes [provider]  tirzepatide Greggory Keen) 10 MG/0.5ML Pen 7 days. 02/17/22  Yes [provider]  Vitamin D, Ergocalciferol, (DRISDOL) 1.25 MG (50000 UNIT) CAPS capsule Take 50,000 Units by mouth once a week. 07/19/22  Yes [provider]  lansoprazole (PREVACID) 15 MG capsule Take by mouth. Patient not taking: Reported on 05/30/2023 12/15/20   [provider]    Allergies as of 05/19/2023 - Review Complete 04/30/2023  Allergen Reaction Noted   Corticosteroids Other (See Comments) 03/30/2020   Nickel Other (See Comments) 03/30/2020   Other Other (See Comments) 03/30/2020   Propylene glycol Other (See Comments) 03/30/2020   Tacrolimus Other (See Comments) 03/28/2023   Minoxidil Dermatitis,  Itching, and Rash 01/13/2017    No family history on file.  Social History   Socioeconomic History   Marital status: Married    Spouse name: Not on file   Number of children: Not on file   Years of education: Not on file   Highest education level: Not on file  Occupational History   Not on file  Tobacco Use   Smoking status: Not on file   Smokeless tobacco: Not on file  Substance and Sexual Activity   Alcohol use: Not on file   Drug use: Not on file   Sexual activity: Not on file  Other Topics Concern   Not on file  Social History Narrative   Not on file   Social Determinants of Health   Financial Resource Strain: Low Risk  (12/29/2022)   Received from [facility], [facility]   Overall Financial Resource Strain (CARDIA)    Difficulty of Paying Living Expenses: Not hard at all  Food Insecurity: No Food Insecurity (12/29/2022)   Received from [facility], [facility]   Hunger Vital Sign    Worried About Running Out of Food in the Last Year: Never true    Ran Out of Food in the Last Year: Never true  Transportation Needs: No Transportation Needs (12/29/2022)   Received from [facility], [facility]   [facility] - Transportation    In the past 12 months, has lack of transportation kept you from medical appointments or from getting medications?: No  Lack of Transportation (Non-Medical): No  Physical Activity: Not on file  Stress: Not on file  Social Connections: Not on file  Intimate Partner Violence: Not on file    Review of Systems: See HPI, otherwise negative ROS  Physical Exam: Ht 5\' 6"  (1.676 m)   Wt 88.9 kg   BMI 31.64 kg/m  General:   Alert, cooperative in NAD Head:  Normocephalic and atraumatic. Respiratory:  Normal work of breathing. Cardiovascular:  RRR  Impression/Plan: Cindy Kidd is here for cataract surgery.  Risks, benefits, limitations,  and alternatives regarding cataract surgery have been reviewed with the patient.  Questions have been answered.  All parties agreeable.   Estanislado Pandy, MD  06/08/2023, 7:14 AM

## 2023-06-08 NOTE — Transfer of Care (Signed)
Immediate Anesthesia Transfer of Care Note  Patient: Cindy Kidd  Procedure(s) Performed: CATARACT EXTRACTION PHACO AND INTRAOCULAR LENS PLACEMENT (IOC) MALYUGIN VISION BLUE OMIDRIA  CLAREON PANOPTIC TORIC LENS 11.28 00:56.6 (Right)  Patient Location: PACU  Anesthesia Type: MAC  Level of Consciousness: awake, alert  and patient cooperative  Airway and Oxygen Therapy: Patient Spontanous Breathing and Patient connected to supplemental oxygen  Post-op Assessment: Post-op Vital signs reviewed, Patient's Cardiovascular Status Stable, Respiratory Function Stable, Patent Airway and No signs of Nausea or vomiting  Post-op Vital Signs: Reviewed and stable  Complications: No notable events documented.

## 2023-06-09 ENCOUNTER — Encounter: Payer: Self-pay | Admitting: Ophthalmology

## 2023-06-27 NOTE — Discharge Instructions (Signed)

## 2023-06-28 NOTE — Anesthesia Preprocedure Evaluation (Signed)
Anesthesia Evaluation  Patient identified by MRN, date of birth, ID band Patient awake    Reviewed: Allergy & Precautions, H&P , NPO status , Patient's Chart, lab work & pertinent test results  Airway Mallampati: I  TM Distance: >3 FB Neck ROM: Full    Dental no notable dental hx. (+) Caps   Pulmonary neg pulmonary ROS   Pulmonary exam normal breath sounds clear to auscultation       Cardiovascular negative cardio ROS Normal cardiovascular exam Rhythm:Regular Rate:Normal     Neuro/Psych negative neurological ROS  negative psych ROS   GI/Hepatic negative GI ROS, Neg liver ROS,,,  Endo/Other  Hypothyroidism    Renal/GU negative Renal ROS  negative genitourinary   Musculoskeletal negative musculoskeletal ROS (+)    Abdominal   Peds negative pediatric ROS (+)  Hematology negative hematology ROS (+)   Anesthesia Other Findings Previous cataract surgery 06-08-23 Dr. Karlton Lemon anesthesiologist  Reproductive/Obstetrics negative OB ROS                             Anesthesia Physical Anesthesia Plan  ASA: 2  Anesthesia Plan: MAC   Post-op Pain Management:    Induction: Intravenous  PONV Risk Score and Plan:   Airway Management Planned: Natural Airway and Nasal Cannula  Additional Equipment:   Intra-op Plan:   Post-operative Plan:   Informed Consent: I have reviewed the patients History and Physical, chart, labs and discussed the procedure including the risks, benefits and alternatives for the proposed anesthesia with the patient or authorized representative who has indicated his/her understanding and acceptance.     Dental Advisory Given  Plan Discussed with: Anesthesiologist, CRNA and Surgeon  Anesthesia Plan Comments: (Patient consented for risks of anesthesia including but not limited to:  - adverse reactions to medications - damage to eyes, teeth, lips or other oral mucosa -  nerve damage due to positioning  - sore throat or hoarseness - Damage to heart, brain, nerves, lungs, other parts of body or loss of life  Patient voiced understanding and assent.)        Anesthesia Quick Evaluation

## 2023-06-29 ENCOUNTER — Other Ambulatory Visit: Payer: Self-pay

## 2023-06-29 ENCOUNTER — Ambulatory Visit: Payer: Medicare PPO | Admitting: General Practice

## 2023-06-29 ENCOUNTER — Encounter
Admission: RE | Disposition: A | Discharge status: Home / Self Care | Payer: Self-pay | Source: Home / Self Care | Attending: Ophthalmology

## 2023-06-29 ENCOUNTER — Ambulatory Visit
Admission: RE | Admit: 2023-06-29 | Discharge: 2023-06-29 | Disposition: A | Discharge status: Home / Self Care | Payer: Medicare PPO | Attending: Ophthalmology | Admitting: Ophthalmology

## 2023-06-29 ENCOUNTER — Encounter: Payer: Self-pay | Admitting: Ophthalmology

## 2023-06-29 DIAGNOSIS — E039 Hypothyroidism, unspecified: Secondary | ICD-10-CM | POA: Diagnosis not present

## 2023-06-29 DIAGNOSIS — Z9841 Cataract extraction status, right eye: Secondary | ICD-10-CM | POA: Diagnosis not present

## 2023-06-29 DIAGNOSIS — H2512 Age-related nuclear cataract, left eye: Secondary | ICD-10-CM | POA: Insufficient documentation

## 2023-06-29 DIAGNOSIS — Z79890 Hormone replacement therapy (postmenopausal): Secondary | ICD-10-CM | POA: Diagnosis not present

## 2023-06-29 HISTORY — PX: CATARACT EXTRACTION W/PHACO: SHX586

## 2023-06-29 LAB — GLUCOSE, CAPILLARY
Glucose-Capillary: 103 mg/dL — ABNORMAL HIGH (ref 70–99)
Glucose-Capillary: 86 mg/dL (ref 70–99)
Glucose-Capillary: 82 mg/dL (ref 70–99)

## 2023-06-29 SURGERY — PHACOEMULSIFICATION, CATARACT, WITH IOL INSERTION
Anesthesia: Monitor Anesthesia Care | Laterality: Left

## 2023-06-29 MED ORDER — ARMC OPHTHALMIC DILATING DROPS
1.0000 | OPHTHALMIC | Status: DC | PRN
  Administered 2023-06-29 (×3): 1 via OPHTHALMIC

## 2023-06-29 MED ORDER — TETRACAINE HCL 0.5 % OP SOLN
1.0000 [drp] | OPHTHALMIC | Status: DC | PRN
  Administered 2023-06-29 (×3): 1 [drp] via OPHTHALMIC

## 2023-06-29 MED ORDER — PHENYLEPHRINE-KETOROLAC 1-0.3 % IO SOLN
INTRAOCULAR | Status: DC | PRN
  Administered 2023-06-29: 74 mL via OPHTHALMIC

## 2023-06-29 MED ORDER — MIDAZOLAM HCL 2 MG/2ML IJ SOLN
INTRAMUSCULAR | Status: AC
  Filled 2023-06-29: qty 2

## 2023-06-29 MED ORDER — FENTANYL CITRATE (PF) 100 MCG/2ML IJ SOLN
INTRAMUSCULAR | Status: DC | PRN
  Administered 2023-06-29: 100 ug via INTRAVENOUS

## 2023-06-29 MED ORDER — ARMC OPHTHALMIC DILATING DROPS
OPHTHALMIC | Status: AC
  Filled 2023-06-29: qty 0.5

## 2023-06-29 MED ORDER — MIDAZOLAM HCL 2 MG/2ML IJ SOLN
INTRAMUSCULAR | Status: DC | PRN
  Administered 2023-06-29: 2 mg via INTRAVENOUS

## 2023-06-29 MED ORDER — DEXTROSE 50 % IV SOLN
INTRAVENOUS | Status: AC
  Filled 2023-06-29: qty 50

## 2023-06-29 MED ORDER — DEXTROSE 50 % IV SOLN
14.0000 mL | Freq: Once | INTRAVENOUS | Status: AC
  Administered 2023-06-29: 14 mL via INTRAVENOUS

## 2023-06-29 MED ORDER — SIGHTPATH DOSE#1 NA HYALUR & NA CHOND-NA HYALUR IO KIT
PACK | INTRAOCULAR | Status: DC | PRN
  Administered 2023-06-29: 1 via OPHTHALMIC

## 2023-06-29 MED ORDER — MOXIFLOXACIN HCL 0.5 % OP SOLN
OPHTHALMIC | Status: DC | PRN
  Administered 2023-06-29: .2 mL via OPHTHALMIC

## 2023-06-29 MED ORDER — SIGHTPATH DOSE#1 BSS IO SOLN
INTRAOCULAR | Status: DC | PRN
  Administered 2023-06-29: 15 mL via INTRAOCULAR

## 2023-06-29 MED ORDER — BRIMONIDINE TARTRATE-TIMOLOL 0.2-0.5 % OP SOLN
OPHTHALMIC | Status: DC | PRN
  Administered 2023-06-29: 1 [drp] via OPHTHALMIC

## 2023-06-29 MED ORDER — LIDOCAINE HCL (PF) 2 % IJ SOLN
INTRAOCULAR | Status: DC | PRN
  Administered 2023-06-29: 1 mL via INTRAOCULAR

## 2023-06-29 MED ORDER — FENTANYL CITRATE (PF) 100 MCG/2ML IJ SOLN
INTRAMUSCULAR | Status: AC
  Filled 2023-06-29: qty 2

## 2023-06-29 MED ORDER — TETRACAINE HCL 0.5 % OP SOLN
OPHTHALMIC | Status: AC
  Filled 2023-06-29: qty 4

## 2023-06-29 SURGICAL SUPPLY — 10 items
CATARACT SUITE SIGHTPATH (MISCELLANEOUS) ×1
DISSECTOR HYDRO NUCLEUS 50X22 (MISCELLANEOUS) ×1 IMPLANT
DRSG TEGADERM 2-3/8X2-3/4 SM (GAUZE/BANDAGES/DRESSINGS) ×1 IMPLANT
FEE CATARACT SUITE SIGHTPATH (MISCELLANEOUS) ×1 IMPLANT
GLOVE SURG SYN 7.5 E (GLOVE) ×1
GLOVE SURG SYN 7.5 PF PI (GLOVE) ×1 IMPLANT
GLOVE SURG SYN 8.5 E (GLOVE) ×1
GLOVE SURG SYN 8.5 PF PI (GLOVE) ×1 IMPLANT
LENS CLAREON PANOPTIX 20.5 ×1 IMPLANT
LENS IOL CLRN PAN 3 20.5 IMPLANT

## 2023-06-29 NOTE — Transfer of Care (Signed)
Immediate Anesthesia Transfer of Care Note  Patient: Cindy Kidd  Procedure(s) Performed: CATARACT EXTRACTION PHACO AND INTRAOCULAR LENS PLACEMENT (IOC) LEFT MALYUGIN VISION BLUE OMIDRIA  CLAREON PANOPTIC TORIC LENS 13.04 01:11.3 (Left)  Patient Location: PACU  Anesthesia Type: MAC  Level of Consciousness: awake, alert  and patient cooperative  Airway and Oxygen Therapy: Patient Spontanous Breathing and Patient connected to supplemental oxygen  Post-op Assessment: Post-op Vital signs reviewed, Patient's Cardiovascular Status Stable, Respiratory Function Stable, Patent Airway and No signs of Nausea or vomiting  Post-op Vital Signs: Reviewed and stable  Complications: No notable events documented.

## 2023-06-29 NOTE — Op Note (Signed)
OPERATIVE NOTE  Cindy Kidd [id number] 06/29/2023   PREOPERATIVE DIAGNOSIS: Nuclear sclerotic cataract left eye. H25.12   POSTOPERATIVE DIAGNOSIS: Nuclear sclerotic cataract left eye. H25.12   PROCEDURE:  Phacoemusification with Toric posterior chamber intraocular lens placement of the left eye  Ultrasound time: Procedure(s): CATARACT EXTRACTION PHACO AND INTRAOCULAR LENS PLACEMENT (IOC) LEFT MALYUGIN VISION BLUE OMIDRIA  CLAREON PANOPTIC TORIC LENS 13.04 01:11.3 (Left)  LENS:   Implant Name Type Inv. Item Serial No. Manufacturer Lot No. LRB No. Used Action  LENS CLAREON PANOPTIX 20.5 - [id number]  LENS CLAREON PANOPTIX 20.5 [id number] [employer]IGHTPATH  Left 1 Implanted    CNWTT3 Toric intraocular lens with 1.50 diopters of cylindrical power with axis orientation at 172 degrees.  SURGEON:  Julious Payer. Rolley Sims, MD   ANESTHESIA:  Topical with tetracaine drops, augmented with 1% preservative-free intracameral lidocaine.   COMPLICATIONS:  None.   DESCRIPTION OF PROCEDURE:  The patient was identified in the holding room and transported to the operating room and placed in the supine position under the operating microscope.  The left eye was identified as the operative eye, which was prepped and draped in the usual sterile ophthalmic fashion.   A 1 millimeter clear-corneal paracentesis was made inferotemporally. Preservative-free 1% lidocaine mixed with 1:1,000 bisulfite-free aqueous solution of epinephrine was injected into the anterior chamber. The anterior chamber was then filled with Viscoat viscoelastic. A 2.4 millimeter keratome was used to make a clear-corneal incision superotemporally. A curvilinear capsulorrhexis was made with a cystotome and capsulorrhexis forceps. Balanced salt solution was used to hydrodissect and hydrodelineate the nucleus. Phacoemulsification was then used to remove the lens nucleus and epinucleus. The remaining cortex was then removed using the irrigation and aspiration  handpiece. Provisc was then placed into the capsular bag to distend it for lens placement. The Verion digital marker was used to align the implant at the intended axis.  A +20.50 D CNWTT3 Toric lens was then injected into the capsular bag.  It was rotated clockwise until the axis marks on the lens were approximately 15 degrees in the counterclockwise direction to the intended alignment.  The viscoelastic was aspirated from the eye using the irrigation aspiration handpiece.  Then, a blunt chopper through the sideport incision was used to rotate the lens in a clockwise direction until the axis markings of the intraocular lens were lined up with the Verion alignment.  Balanced salt solution was then used to hydrate the wounds.   The anterior chamber was inflated to a physiologic pressure with balanced salt solution.  No wound leaks were noted. Vigamox was injected intracamerally.  Timolol and Brimonidine drops were applied to the eye.  The patient was taken to the recovery room in stable condition without complications of anesthesia or surgery.  [name] [place] 06/29/2023, 11:09 AM

## 2023-06-29 NOTE — Anesthesia Postprocedure Evaluation (Signed)
Anesthesia Post Note  Patient: Cindy Kidd  Procedure(s) Performed: CATARACT EXTRACTION PHACO AND INTRAOCULAR LENS PLACEMENT (IOC) LEFT MALYUGIN VISION BLUE OMIDRIA  CLAREON PANOPTIC TORIC LENS 13.04 01:11.3 (Left)  Anesthesia Type: MAC Anesthetic complications: no Comments: Patient's husband is physician, endocrinologist treats diabetes, he was very happy with her care, said, "You did a good job." Patient also happy with her care.     No notable events documented.   Last Vitals:  Vitals:   06/29/23 0941  BP: 128/80  Resp: 14  SpO2: 98%    Last Pain:  Vitals:   06/29/23 0941  TempSrc: Temporal  PainSc: 0-No pain                 Tersea Aulds C Marshell Rieger

## 2023-06-29 NOTE — H&P (Signed)
[facility]   Primary Care Physician:  Enid Baas, MD Ophthalmologist: Dr. Deberah Pelton  Pre-Procedure History & Physical: HPI:  Cindy Kidd is a 72 y.o. female here for cataract surgery.   Past Medical History:  Diagnosis Date   Hypothyroidism     Past Surgical History:  Procedure Laterality Date   BREAST BIOPSY     CATARACT EXTRACTION W/PHACO Right 06/08/2023   Procedure: CATARACT EXTRACTION PHACO AND INTRAOCULAR LENS PLACEMENT (IOC) MALYUGIN VISION BLUE OMIDRIA  CLAREON PANOPTIC TORIC LENS 11.28 00:56.6;  Surgeon: Estanislado Pandy, MD;  Location: [facility];  Service: Ophthalmology;  Laterality: Right;    Prior to Admission medications   Medication Sig Start Date End Date Taking? Authorizing Provider  atorvastatin (LIPITOR) 10 MG tablet Take 1 tablet by mouth daily. 12/20/21   [provider]  Cyanocobalamin (VITAMIN B 12) 500 MCG TABS Take 1,000 mcg/day by mouth.    [provider]  dutasteride (AVODART) 0.5 MG capsule Take 1 capsule (0.5 mg total) by mouth daily. 03/29/23   Elie Goody, MD  famotidine (PEPCID) 20 MG tablet Take by mouth. 10/08/20   [provider]  ketoconazole (NIZORAL) 2 % shampoo apply three times per week, massage into scalp and leave in for 10 minutes before rinsing out 03/29/23   Elie Goody, MD  lansoprazole (PREVACID) 15 MG capsule Take by mouth. 12/15/20   [provider]  levothyroxine (SYNTHROID) 125 MCG tablet Take 1 tablet by mouth daily. 01/18/22   [provider]  loperamide (IMODIUM) 2 MG capsule Take by mouth.    [provider]  tirzepatide Greggory Keen) 10 MG/0.5ML Pen 7 days. 02/17/22   [provider]  Vitamin D, Ergocalciferol, (DRISDOL) 1.25 MG (50000 UNIT) CAPS capsule Take 50,000 Units by mouth once a week. 07/19/22   [provider]    Allergies as of 05/19/2023 - Review Complete 04/30/2023  Allergen Reaction Noted    Corticosteroids Other (See Comments) 03/30/2020   Nickel Other (See Comments) 03/30/2020   Other Other (See Comments) 03/30/2020   Propylene glycol Other (See Comments) 03/30/2020   Tacrolimus Other (See Comments) 03/28/2023   Minoxidil Dermatitis, Itching, and Rash 01/13/2017    No family history on file.  Social History   Socioeconomic History   Marital status: Married    Spouse name: Not on file   Number of children: Not on file   Years of education: Not on file   Highest education level: Not on file  Occupational History   Not on file  Tobacco Use   Smoking status: Not on file   Smokeless tobacco: Not on file  Substance and Sexual Activity   Alcohol use: Not on file   Drug use: Not on file   Sexual activity: Not on file  Other Topics Concern   Not on file  Social History Narrative   Not on file   Social Determinants of Health   Financial Resource Strain: Low Risk  (12/29/2022)   Received from [facility], [facility]   Overall Financial Resource Strain (CARDIA)    Difficulty of Paying Living Expenses: Not hard at all  Food Insecurity: No Food Insecurity (12/29/2022)   Received from [facility], [facility]   Hunger Vital Sign    Worried About Running Out of Food in the Last Year: Never true    Ran Out of Food in the Last Year: Never true  Transportation Needs: No Transportation Needs (  12/29/2022)   Received from [facility], [facility]   [facility] - Transportation    In the past 12 months, has lack of transportation kept you from medical appointments or from getting medications?: No    Lack of Transportation (Non-Medical): No  Physical Activity: Not on file  Stress: Not on file  Social Connections: Not on file  Intimate Partner Violence: Not on file    Review of Systems: See HPI, otherwise negative ROS  Physical Exam: There were no vitals taken for this  visit. General:   Alert, cooperative in NAD Head:  Normocephalic and atraumatic. Respiratory:  Normal work of breathing. Cardiovascular:  RRR  Impression/Plan: Cindy Kidd is here for cataract surgery.  Risks, benefits, limitations, and alternatives regarding cataract surgery have been reviewed with the patient.  Questions have been answered.  All parties agreeable.   Estanislado Pandy, MD  06/29/2023, 7:06 AM

## 2023-07-03 ENCOUNTER — Encounter: Payer: Self-pay | Admitting: Ophthalmology

## 2023-07-25 ENCOUNTER — Ambulatory Visit: Payer: Medicare PPO | Admitting: Dermatology

## 2023-10-02 ENCOUNTER — Ambulatory Visit: Payer: Medicare PPO | Admitting: Dermatology

## 2024-01-22 ENCOUNTER — Ambulatory Visit

## 2024-01-22 DIAGNOSIS — K5730 Diverticulosis of large intestine without perforation or abscess without bleeding: Secondary | ICD-10-CM | POA: Diagnosis not present

## 2024-01-22 DIAGNOSIS — K219 Gastro-esophageal reflux disease without esophagitis: Secondary | ICD-10-CM | POA: Diagnosis not present

## 2024-01-22 DIAGNOSIS — K5289 Other specified noninfective gastroenteritis and colitis: Secondary | ICD-10-CM | POA: Diagnosis not present

## 2024-01-22 DIAGNOSIS — K2970 Gastritis, unspecified, without bleeding: Secondary | ICD-10-CM | POA: Diagnosis not present

## 2024-01-22 DIAGNOSIS — Z860100 Personal history of colon polyps, unspecified: Secondary | ICD-10-CM | POA: Diagnosis not present

## 2024-01-22 DIAGNOSIS — K449 Diaphragmatic hernia without obstruction or gangrene: Secondary | ICD-10-CM | POA: Diagnosis not present

## 2024-01-22 DIAGNOSIS — Z09 Encounter for follow-up examination after completed treatment for conditions other than malignant neoplasm: Secondary | ICD-10-CM | POA: Diagnosis not present

## 2024-01-22 DIAGNOSIS — K641 Second degree hemorrhoids: Secondary | ICD-10-CM

## 2024-08-05 ENCOUNTER — Other Ambulatory Visit: Payer: Self-pay | Admitting: Internal Medicine

## 2024-08-05 ENCOUNTER — Inpatient Hospital Stay
Admission: RE | Admit: 2024-08-05 | Discharge: 2024-08-05 | Disposition: A | Payer: Self-pay | Source: Ambulatory Visit | Attending: Internal Medicine | Admitting: Internal Medicine

## 2024-08-05 ENCOUNTER — Other Ambulatory Visit: Payer: Self-pay | Admitting: *Deleted

## 2024-08-05 DIAGNOSIS — Z1231 Encounter for screening mammogram for malignant neoplasm of breast: Secondary | ICD-10-CM

## 2024-08-05 DIAGNOSIS — R921 Mammographic calcification found on diagnostic imaging of breast: Secondary | ICD-10-CM

## 2024-08-05 DIAGNOSIS — N6489 Other specified disorders of breast: Secondary | ICD-10-CM

## 2024-08-28 ENCOUNTER — Ambulatory Visit
Admission: RE | Admit: 2024-08-28 | Discharge: 2024-08-28 | Disposition: A | Discharge status: Home / Self Care | Source: Ambulatory Visit | Attending: Internal Medicine | Admitting: Internal Medicine

## 2024-08-28 DIAGNOSIS — N6489 Other specified disorders of breast: Secondary | ICD-10-CM

## 2024-08-28 DIAGNOSIS — R921 Mammographic calcification found on diagnostic imaging of breast: Secondary | ICD-10-CM | POA: Diagnosis present
